# Patient Record
Sex: Female | Born: 1950 | Race: Black or African American | Hispanic: No
Health system: Southern US, Community
[De-identification: ages and names within clinical notes are randomized; demographics above are authoritative.]

## PROBLEM LIST (undated history)

## (undated) DIAGNOSIS — E119 Type 2 diabetes mellitus without complications: Secondary | ICD-10-CM

## (undated) DIAGNOSIS — I10 Essential (primary) hypertension: Secondary | ICD-10-CM

## (undated) DIAGNOSIS — K08109 Complete loss of teeth, unspecified cause, unspecified class: Secondary | ICD-10-CM

## (undated) HISTORY — DX: Essential (primary) hypertension: I10

## (undated) HISTORY — DX: Type 2 diabetes mellitus without complications: E11.9

---

## 2005-08-13 ENCOUNTER — Ambulatory Visit: Payer: Self-pay

## 2006-08-09 ENCOUNTER — Emergency Department: Payer: Self-pay | Admitting: Emergency Medicine

## 2006-08-16 ENCOUNTER — Emergency Department: Payer: Self-pay | Admitting: Emergency Medicine

## 2009-02-28 ENCOUNTER — Inpatient Hospital Stay: Payer: Self-pay | Admitting: *Deleted

## 2010-03-27 IMAGING — CR DG CHEST 2V
1 series · 2 of 2 positions shown · non-contrast
Comparison: none

REASON FOR EXAM: dizziness
COMMENTS:

PROCEDURE:     DXR - DXR CHEST PA (OR AP) AND LATERAL  - February 28, 2009  [DATE]
RESULT:
The lungs are clear.  The cardiac silhouette and visualized bony skeleton
are unremarkable.

[Series 1: view not recorded · 0.17mm/px · 2 of 2 slices shown]
[im 1/2]
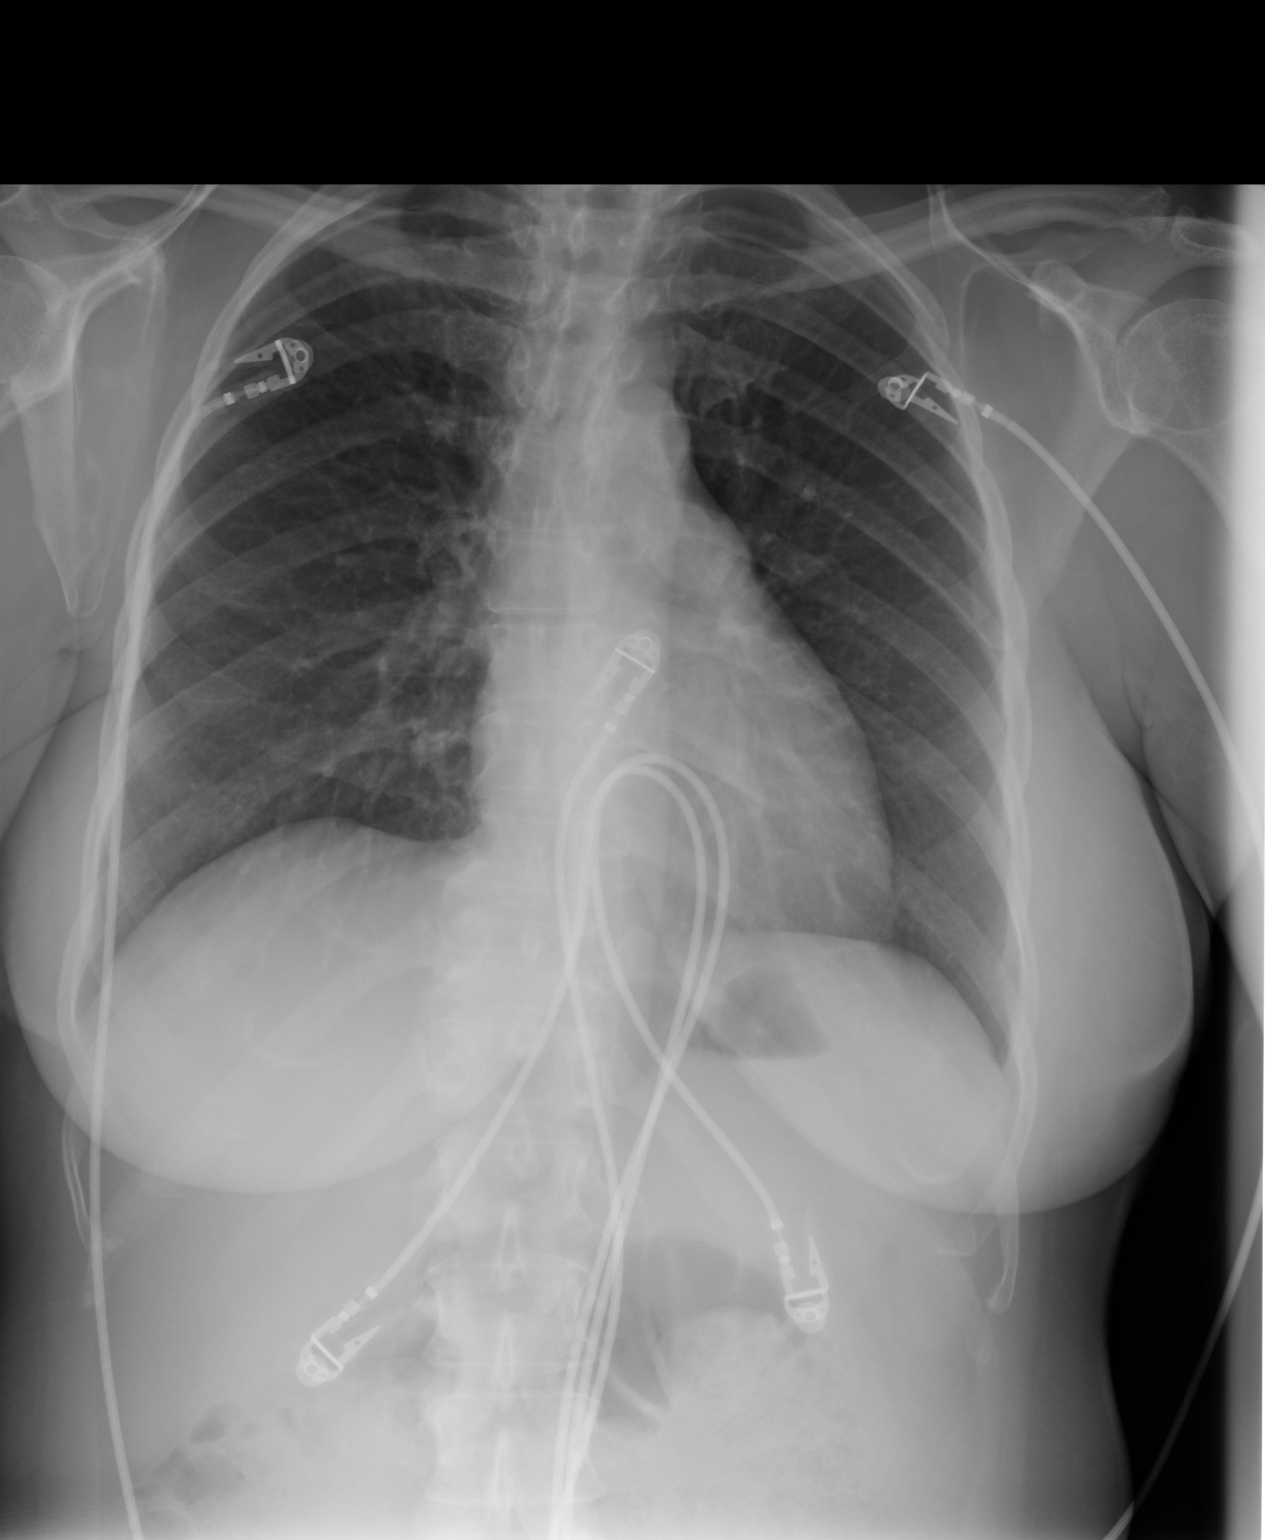
[im 2/2]
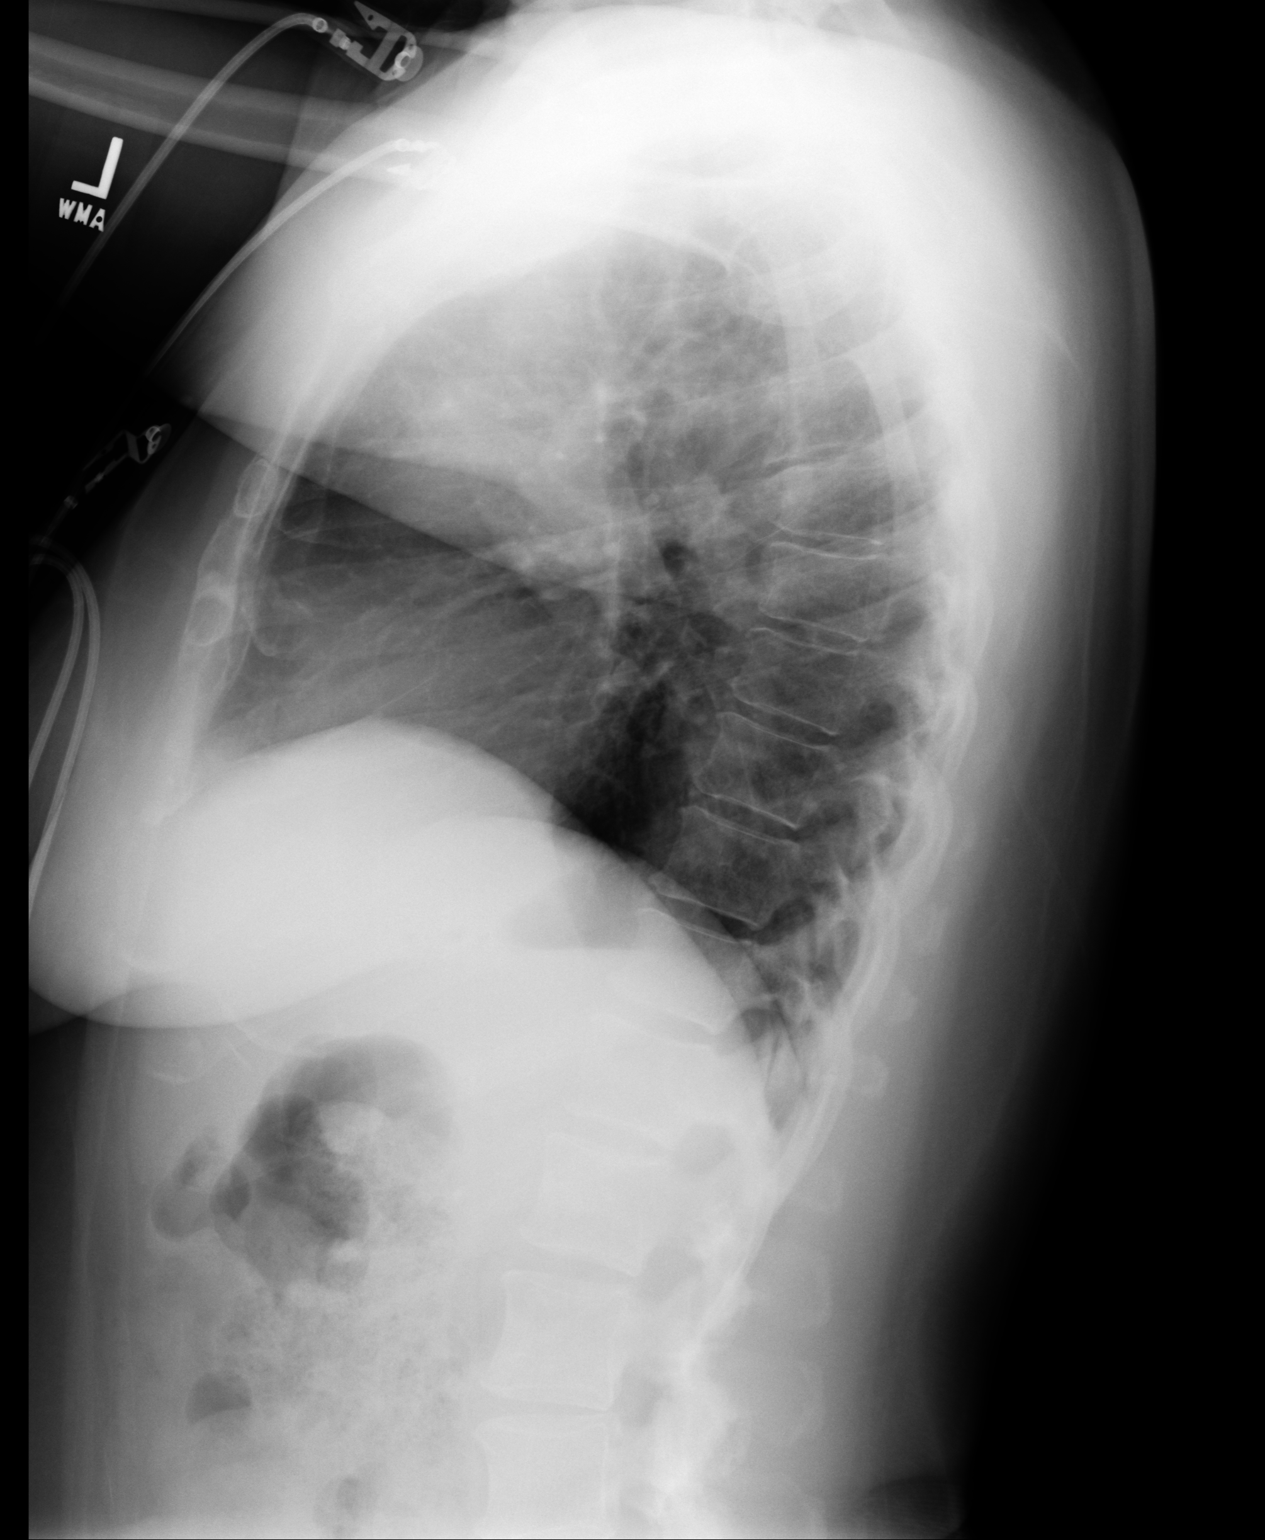

[2 of 2 positions shown; findings below may reference images not displayed]

IMPRESSION: Chest radiograph without evidence of acute cardiopulmonary disease.

## 2014-06-22 ENCOUNTER — Emergency Department: Payer: Self-pay | Admitting: Emergency Medicine

## 2014-06-22 LAB — CBC WITH DIFFERENTIAL/PLATELET
BASOS PCT: 1 %
Basophil #: 0.1 10*3/uL (ref 0.0–0.1)
EOS PCT: 2.4 %
Eosinophil #: 0.1 10*3/uL (ref 0.0–0.7)
HCT: 34.9 % — ABNORMAL LOW (ref 35.0–47.0)
HGB: 11.3 g/dL — AB (ref 12.0–16.0)
LYMPHS PCT: 29.4 %
Lymphocyte #: 1.7 10*3/uL (ref 1.0–3.6)
MCH: 29.5 pg (ref 26.0–34.0)
MCHC: 32.3 g/dL (ref 32.0–36.0)
MCV: 91 fL (ref 80–100)
MONOS PCT: 8.8 %
Monocyte #: 0.5 x10 3/mm (ref 0.2–0.9)
NEUTROS ABS: 3.4 10*3/uL (ref 1.4–6.5)
Neutrophil %: 58.4 %
PLATELETS: 279 10*3/uL (ref 150–440)
RBC: 3.82 10*6/uL (ref 3.80–5.20)
RDW: 14.2 % (ref 11.5–14.5)
WBC: 5.8 10*3/uL (ref 3.6–11.0)

## 2014-06-22 LAB — COMPREHENSIVE METABOLIC PANEL
ALBUMIN: 4.1 g/dL (ref 3.4–5.0)
ALK PHOS: 68 U/L
ALT: 19 U/L
Anion Gap: 9 (ref 7–16)
BILIRUBIN TOTAL: 0.1 mg/dL — AB (ref 0.2–1.0)
BUN: 30 mg/dL — AB (ref 7–18)
Calcium, Total: 8.9 mg/dL (ref 8.5–10.1)
Chloride: 110 mmol/L — ABNORMAL HIGH (ref 98–107)
Co2: 20 mmol/L — ABNORMAL LOW (ref 21–32)
Creatinine: 1.6 mg/dL — ABNORMAL HIGH (ref 0.60–1.30)
EGFR (African American): 39 — ABNORMAL LOW
EGFR (Non-African Amer.): 34 — ABNORMAL LOW
Glucose: 146 mg/dL — ABNORMAL HIGH (ref 65–99)
Osmolality: 286 (ref 275–301)
Potassium: 4.6 mmol/L (ref 3.5–5.1)
SGOT(AST): 13 U/L — ABNORMAL LOW (ref 15–37)
SODIUM: 139 mmol/L (ref 136–145)
Total Protein: 8 g/dL (ref 6.4–8.2)

## 2014-10-03 LAB — TSH: TSH: 1.25 u[IU]/mL (ref 0.41–5.90)

## 2014-10-11 DIAGNOSIS — D649 Anemia, unspecified: Secondary | ICD-10-CM | POA: Insufficient documentation

## 2015-01-02 LAB — LIPID PANEL
CHOLESTEROL: 260 mg/dL — AB (ref 0–200)
HDL: 54 mg/dL (ref 35–70)
LDL CALC: 187 mg/dL
Triglycerides: 94 mg/dL (ref 40–160)

## 2015-01-02 LAB — HEPATIC FUNCTION PANEL
ALT: 12 U/L (ref 7–35)
AST: 10 U/L — AB (ref 13–35)
Alkaline Phosphatase: 60 U/L (ref 25–125)
Bilirubin, Total: 0.2 mg/dL

## 2015-01-02 LAB — BASIC METABOLIC PANEL
BUN: 21 mg/dL (ref 4–21)
Creatinine: 1 mg/dL (ref 0.5–1.1)
GLUCOSE: 121 mg/dL
POTASSIUM: 5.3 mmol/L (ref 3.4–5.3)
Sodium: 142 mmol/L (ref 137–147)

## 2015-01-02 LAB — HEMOGLOBIN A1C: Hemoglobin A1C: 7

## 2015-01-17 DIAGNOSIS — E538 Deficiency of other specified B group vitamins: Secondary | ICD-10-CM | POA: Insufficient documentation

## 2015-02-14 ENCOUNTER — Ambulatory Visit: Payer: Self-pay

## 2015-04-11 ENCOUNTER — Other Ambulatory Visit: Payer: Self-pay

## 2015-04-11 ENCOUNTER — Ambulatory Visit: Payer: Self-pay | Admitting: Internal Medicine

## 2015-04-11 LAB — CBC AND DIFFERENTIAL
HEMATOCRIT: 35 % — AB (ref 36–46)
HEMOGLOBIN: 11.5 g/dL — AB (ref 12.0–16.0)
NEUTROS ABS: 2 /uL
Platelets: 246 10*3/uL (ref 150–399)
WBC: 4.6 10*3/mL

## 2015-04-18 ENCOUNTER — Ambulatory Visit: Payer: Self-pay | Admitting: Internal Medicine

## 2015-04-18 DIAGNOSIS — E785 Hyperlipidemia, unspecified: Secondary | ICD-10-CM | POA: Insufficient documentation

## 2015-04-26 ENCOUNTER — Ambulatory Visit: Payer: Self-pay

## 2015-04-26 DIAGNOSIS — E119 Type 2 diabetes mellitus without complications: Secondary | ICD-10-CM | POA: Insufficient documentation

## 2015-04-26 DIAGNOSIS — I10 Essential (primary) hypertension: Secondary | ICD-10-CM | POA: Insufficient documentation

## 2015-08-01 ENCOUNTER — Other Ambulatory Visit: Payer: Self-pay

## 2015-08-08 ENCOUNTER — Ambulatory Visit: Payer: Self-pay | Admitting: Internal Medicine

## 2016-04-16 DIAGNOSIS — E538 Deficiency of other specified B group vitamins: Secondary | ICD-10-CM

## 2016-04-16 DIAGNOSIS — E119 Type 2 diabetes mellitus without complications: Secondary | ICD-10-CM

## 2016-04-16 DIAGNOSIS — I10 Essential (primary) hypertension: Secondary | ICD-10-CM

## 2016-04-16 DIAGNOSIS — D649 Anemia, unspecified: Secondary | ICD-10-CM

## 2016-04-16 DIAGNOSIS — E785 Hyperlipidemia, unspecified: Secondary | ICD-10-CM

## 2019-12-05 ENCOUNTER — Ambulatory Visit: Payer: Medicare Other | Attending: Internal Medicine

## 2019-12-05 DIAGNOSIS — Z23 Encounter for immunization: Secondary | ICD-10-CM

## 2019-12-05 NOTE — Progress Notes (Signed)
   Covid-19 Vaccination Clinic  Name:  Margaret Rose    MRN: [id number] DOB: [birth year]  12/05/2019  Margaret Rose was observed post Covid-19 immunization for 15 minutes without incidence. She was provided with Vaccine Information Sheet and instruction to access the V-Safe system.   Margaret Rose was instructed to call 911 with any severe reactions post vaccine: Marland Kitchen Difficulty breathing  . Swelling of your face and throat  . A fast heartbeat  . A bad rash all over your body  . Dizziness and weakness    Immunizations Administered    Name Date Dose VIS Date Route   Moderna COVID-19 Vaccine 12/05/2019  3:51 PM 0.5 mL 09/13/2019 Intramuscular   Manufacturer: Moderna   Lot: [id number]   NDC: 86761-950-93

## 2020-01-03 ENCOUNTER — Ambulatory Visit: Payer: Medicare Other | Attending: Internal Medicine

## 2020-01-03 DIAGNOSIS — Z23 Encounter for immunization: Secondary | ICD-10-CM

## 2020-01-03 NOTE — Progress Notes (Signed)
   Covid-19 Vaccination Clinic  Name:  Margaret Rose    MRN: [id number] DOB: [birth year]  01/03/2020  [name] was observed post Covid-19 immunization for 15 minutes without incident. She was provided with Vaccine Information Sheet and instruction to access the V-Safe system.   [name] was instructed to call 911 with any severe reactions post vaccine: Marland Kitchen Difficulty breathing  . Swelling of face and throat  . A fast heartbeat  . A bad rash all over body  . Dizziness and weakness   Immunizations Administered    Name Date Dose VIS Date Route   Moderna COVID-19 Vaccine 01/03/2020 10:39 AM 0.5 mL 09/13/2019 Intramuscular   Manufacturer: Gala Murdoch   Lot: [id number]   NDC: 34287-681-15

## 2023-11-09 ENCOUNTER — Ambulatory Visit: Admit: 2023-11-09 | Payer: 59 | Admitting: Ophthalmology

## 2023-11-09 SURGERY — CATARACT EXTRACTION PHACO AND INTRAOCULAR LENS PLACEMENT (IOC)
Anesthesia: Topical | Laterality: Right

## 2023-11-30 ENCOUNTER — Ambulatory Visit: Admit: 2023-11-30 | Payer: 59 | Admitting: Ophthalmology

## 2023-11-30 SURGERY — PHACOEMULSIFICATION, CATARACT, WITH IOL INSERTION
Anesthesia: Topical | Laterality: Right

## 2024-03-03 ENCOUNTER — Encounter: Payer: Self-pay | Admitting: Ophthalmology

## 2024-03-04 NOTE — Anesthesia Preprocedure Evaluation (Addendum)
 Anesthesia Evaluation  Patient identified by MRN, date of birth, ID band Patient awake    Reviewed: Allergy & Precautions, H&P , NPO status , Patient's Chart, lab work & pertinent test results  Airway Mallampati: III  TM Distance: <3 FB Neck ROM: Full    Dental no notable dental hx. (+) Edentulous Upper, Edentulous Lower   Pulmonary neg pulmonary ROS   Pulmonary exam normal breath sounds clear to auscultation       Cardiovascular hypertension, Normal cardiovascular exam Rhythm:Regular Rate:Normal     Neuro/Psych negative neurological ROS  negative psych ROS   GI/Hepatic negative GI ROS, Neg liver ROS,,,  Endo/Other  diabetes    Renal/GU negative Renal ROS  negative genitourinary   Musculoskeletal negative musculoskeletal ROS (+)    Abdominal   Peds negative pediatric ROS (+)  Hematology negative hematology ROS (+) Blood dyscrasia, anemia   Anesthesia Other Findings Diabetes melliltus HTN   Reproductive/Obstetrics negative OB ROS                             Anesthesia Physical Anesthesia Plan  ASA: 2  Anesthesia Plan: MAC   Post-op Pain Management:    Induction: Intravenous  PONV Risk Score and Plan:   Airway Management Planned: Natural Airway and Nasal Cannula  Additional Equipment:   Intra-op Plan:   Post-operative Plan:   Informed Consent: I have reviewed the patients History and Physical, chart, labs and discussed the procedure including the risks, benefits and alternatives for the proposed anesthesia with the patient or authorized representative who has indicated his/her understanding and acceptance.     Dental Advisory Given  Plan Discussed with: Anesthesiologist, CRNA and Surgeon  Anesthesia Plan Comments: (Patient consented for risks of anesthesia including but not limited to:  - adverse reactions to medications - damage to eyes, teeth, lips or other oral  mucosa - nerve damage due to positioning  - sore throat or hoarseness - Damage to heart, brain, nerves, lungs, other parts of body or loss of life  Patient voiced understanding and assent.)       Anesthesia Quick Evaluation

## 2024-03-11 NOTE — Discharge Instructions (Signed)

## 2024-03-14 ENCOUNTER — Other Ambulatory Visit: Payer: Self-pay

## 2024-03-14 ENCOUNTER — Ambulatory Visit: Payer: Self-pay | Admitting: Anesthesiology

## 2024-03-14 ENCOUNTER — Ambulatory Visit
Admission: RE | Admit: 2024-03-14 | Discharge: 2024-03-14 | Disposition: A | Discharge status: Home / Self Care | Attending: Ophthalmology | Admitting: Ophthalmology

## 2024-03-14 ENCOUNTER — Encounter
Admission: RE | Disposition: A | Discharge status: Home / Self Care | Payer: Self-pay | Source: Home / Self Care | Attending: Ophthalmology

## 2024-03-14 ENCOUNTER — Encounter: Payer: Self-pay | Admitting: Ophthalmology

## 2024-03-14 DIAGNOSIS — E1136 Type 2 diabetes mellitus with diabetic cataract: Secondary | ICD-10-CM | POA: Insufficient documentation

## 2024-03-14 DIAGNOSIS — H2511 Age-related nuclear cataract, right eye: Secondary | ICD-10-CM | POA: Diagnosis not present

## 2024-03-14 DIAGNOSIS — Z7984 Long term (current) use of oral hypoglycemic drugs: Secondary | ICD-10-CM | POA: Diagnosis not present

## 2024-03-14 DIAGNOSIS — I10 Essential (primary) hypertension: Secondary | ICD-10-CM | POA: Diagnosis not present

## 2024-03-14 DIAGNOSIS — H2181 Floppy iris syndrome: Secondary | ICD-10-CM | POA: Diagnosis not present

## 2024-03-14 DIAGNOSIS — H2589 Other age-related cataract: Secondary | ICD-10-CM | POA: Diagnosis present

## 2024-03-14 HISTORY — PX: CATARACT EXTRACTION W/PHACO: SHX586

## 2024-03-14 HISTORY — DX: Complete loss of teeth, unspecified cause, unspecified class: K08.109

## 2024-03-14 LAB — GLUCOSE, CAPILLARY
Glucose-Capillary: 79 mg/dL (ref 70–99)
Glucose-Capillary: 87 mg/dL (ref 70–99)

## 2024-03-14 SURGERY — PHACOEMULSIFICATION, CATARACT, WITH IOL INSERTION
Anesthesia: Monitor Anesthesia Care | Site: Eye | Laterality: Right

## 2024-03-14 MED ORDER — FENTANYL CITRATE (PF) 100 MCG/2ML IJ SOLN
INTRAMUSCULAR | Status: AC
  Filled 2024-03-14: qty 2

## 2024-03-14 MED ORDER — SIGHTPATH DOSE#1 NA HYALUR & NA CHOND-NA HYALUR IO KIT
PACK | INTRAOCULAR | Status: DC | PRN
  Administered 2024-03-14: 1 via OPHTHALMIC

## 2024-03-14 MED ORDER — SIGHTPATH DOSE#1 BSS IO SOLN
INTRAOCULAR | Status: DC | PRN
  Administered 2024-03-14: 15 mL via INTRAOCULAR

## 2024-03-14 MED ORDER — ARMC OPHTHALMIC DILATING DROPS
1.0000 | OPHTHALMIC | Status: DC | PRN
  Administered 2024-03-14 (×3): 1 via OPHTHALMIC

## 2024-03-14 MED ORDER — FENTANYL CITRATE (PF) 100 MCG/2ML IJ SOLN
INTRAMUSCULAR | Status: DC | PRN
  Administered 2024-03-14: 50 ug via INTRAVENOUS

## 2024-03-14 MED ORDER — MOXIFLOXACIN HCL 0.5 % OP SOLN
OPHTHALMIC | Status: DC | PRN
  Administered 2024-03-14: .2 mL via OPHTHALMIC

## 2024-03-14 MED ORDER — MIDAZOLAM HCL 2 MG/2ML IJ SOLN
INTRAMUSCULAR | Status: DC | PRN
  Administered 2024-03-14: 2 mg via INTRAVENOUS

## 2024-03-14 MED ORDER — ARMC OPHTHALMIC DILATING DROPS
OPHTHALMIC | Status: AC
  Filled 2024-03-14: qty 0.5

## 2024-03-14 MED ORDER — MIDAZOLAM HCL 2 MG/2ML IJ SOLN
INTRAMUSCULAR | Status: AC
  Filled 2024-03-14: qty 2

## 2024-03-14 MED ORDER — SIGHTPATH DOSE#1 BSS IO SOLN
INTRAOCULAR | Status: DC | PRN
  Administered 2024-03-14: 137 mL via OPHTHALMIC

## 2024-03-14 MED ORDER — TRYPAN BLUE 0.06 % IO SOSY
PREFILLED_SYRINGE | INTRAOCULAR | Status: DC | PRN
  Administered 2024-03-14: .5 mL via INTRAOCULAR

## 2024-03-14 MED ORDER — DEXTROSE 50 % IV SOLN
INTRAVENOUS | Status: AC
Start: 2024-03-14 — End: ?
  Filled 2024-03-14: qty 50

## 2024-03-14 MED ORDER — LACTATED RINGERS IV SOLN: INTRAVENOUS | Status: DC

## 2024-03-14 MED ORDER — TETRACAINE HCL 0.5 % OP SOLN
1.0000 [drp] | OPHTHALMIC | Status: DC | PRN
  Administered 2024-03-14 (×3): 1 [drp] via OPHTHALMIC

## 2024-03-14 MED ORDER — LIDOCAINE HCL (PF) 2 % IJ SOLN
INTRAOCULAR | Status: DC | PRN
  Administered 2024-03-14: 4 mL via INTRAOCULAR

## 2024-03-14 MED ORDER — DEXTROSE 50 % IV SOLN
12.0000 mL | Freq: Once | INTRAVENOUS | Status: AC
  Administered 2024-03-14: 12 mL via INTRAVENOUS

## 2024-03-14 MED ORDER — TETRACAINE HCL 0.5 % OP SOLN
OPHTHALMIC | Status: AC
  Filled 2024-03-14: qty 4

## 2024-03-14 SURGICAL SUPPLY — 15 items
CANNULA ANT/CHMB 27G (MISCELLANEOUS) IMPLANT
CANNULA ANT/CHMB 27GA (MISCELLANEOUS) ×1 IMPLANT
CATARACT SUITE SIGHTPATH (MISCELLANEOUS) ×1 IMPLANT
DISSECTOR HYDRO NUCLEUS 50X22 (MISCELLANEOUS) ×1 IMPLANT
FEE CATARACT SUITE SIGHTPATH (MISCELLANEOUS) ×1 IMPLANT
GLOVE PI ULTRA LF STRL 7.5 (GLOVE) ×1 IMPLANT
GLOVE SURG POLYISOPRENE 8.5 (GLOVE) ×1 IMPLANT
GLOVE SURG PROTEXIS BL SZ6.5 (GLOVE) ×1 IMPLANT
GLOVE SURG SYN 6.5 PF PI BL (GLOVE) ×1 IMPLANT
GLOVE SURG SYN 8.5 PF PI BL (GLOVE) ×1 IMPLANT
LENS IOL TECNIS EYHANCE 21.5 (Intraocular Lens) IMPLANT
NDL FILTER BLUNT 18X1 1/2 (NEEDLE) ×1 IMPLANT
NEEDLE FILTER BLUNT 18X1 1/2 (NEEDLE) ×1 IMPLANT
RING MALYGIN (MISCELLANEOUS) IMPLANT
SYR 3ML LL SCALE MARK (SYRINGE) ×1 IMPLANT

## 2024-03-14 NOTE — Anesthesia Postprocedure Evaluation (Signed)
 Anesthesia Post Note  Patient: Margaret Rose  Procedure(s) Performed: PHACOEMULSIFICATION, CATARACT, WITH IOL INSERTION 30.32 02:18.7 (Right: Eye)  Patient location during evaluation: PACU Anesthesia Type: MAC Level of consciousness: awake and alert Pain management: pain level controlled Vital Signs Assessment: post-procedure vital signs reviewed and stable Respiratory status: spontaneous breathing, nonlabored ventilation, respiratory function stable and patient connected to nasal cannula oxygen Cardiovascular status: stable and blood pressure returned to baseline Postop Assessment: no apparent nausea or vomiting Anesthetic complications: no   No notable events documented.   Last Vitals:  Vitals:   03/14/24 0756 03/14/24 0801  BP: 130/77 135/74  Pulse: 74 73  Resp: 15 17  Temp: (!) 36.2 C (!) 36.2 C  SpO2: 98% 98%    Last Pain:  Vitals:   03/14/24 0801  TempSrc:   PainSc: 0-No pain                 Mireya Meditz C Jhonatan Lomeli

## 2024-03-14 NOTE — Transfer of Care (Signed)
 Immediate Anesthesia Transfer of Care Note  Patient: Margaret Rose  Procedure(s) Performed: PHACOEMULSIFICATION, CATARACT, WITH IOL INSERTION 30.32 02:18.7 (Right: Eye)  Patient Location: PACU  Anesthesia Type: MAC  Level of Consciousness: awake, alert  and patient cooperative  Airway and Oxygen Therapy: Patient Spontanous Breathing and Patient connected to supplemental oxygen  Post-op Assessment: Post-op Vital signs reviewed, Patient's Cardiovascular Status Stable, Respiratory Function Stable, Patent Airway and No signs of Nausea or vomiting  Post-op Vital Signs: Reviewed and stable  Complications: No notable events documented.

## 2024-03-14 NOTE — H&P (Signed)
 [facility]   Primary Care Physician:  Laurence Pons, NP Ophthalmologist: Dr. Dusty Gin  Pre-Procedure History & Physical: HPI:  Margaret Rose is a 73 y.o. female here for cataract surgery.   Past Medical History:  Diagnosis Date   Diabetes mellitus without complication (HCC)    Hypertension     Past Surgical History:  Procedure Laterality Date   CESAREAN SECTION     x 2    Prior to Admission medications   Medication Sig Start Date End Date Taking? Authorizing Provider  amLODipine (NORVASC) 10 MG tablet Take 10 mg by mouth daily.   Yes [provider]  aspirin 81 MG tablet Take 81 mg by mouth daily.   Yes [provider]  ezetimibe (ZETIA) 10 MG tablet Take 10 mg by mouth daily.   Yes [provider]  glipiZIDE (GLUCOTROL) 5 MG tablet Take by mouth 2 (two) times daily before a meal.   Yes [provider]  lisinopril (PRINIVIL,ZESTRIL) 20 MG tablet Take 20 mg by mouth 2 (two) times daily.   Yes [provider]  metFORMIN (GLUCOPHAGE) 1000 MG tablet Take 1,000 mg by mouth 2 (two) times daily with a meal.   Yes [provider]    Allergies as of 01/21/2024 - never reviewed  Allergen Reaction Noted   Statins  04/16/2016    Family History  Problem Relation Age of Onset   Heart disease Mother    Renal Disease Mother    Heart disease Father     Social History   Socioeconomic History   Marital status: Widowed    Spouse name: Not on file   Number of children: Not on file   Years of education: Not on file   Highest education level: Not on file  Occupational History   Not on file  Tobacco Use   Smoking status: Never   Smokeless tobacco: Never  Vaping Use   Vaping status: Never Used  Substance and Sexual Activity   Alcohol use: No   Drug use: No   Sexual activity: Not on file  Other Topics Concern   Not on file  Social History Narrative   Not on file   Social Drivers of Health   Financial  Resource Strain: Low Risk  (10/22/2023)   Received from [facility]   Overall Financial Resource Strain (CARDIA)    Difficulty of Paying Living Expenses: Not hard at all  Food Insecurity: No Food Insecurity (10/22/2023)   Received from [facility]   Hunger Vital Sign    Worried About Running Out of Food in the Last Year: Never true    Ran Out of Food in the Last Year: Never true  Transportation Needs: No Transportation Needs (10/22/2023)   Received from [facility]   PRAPARE - Transportation    Lack of Transportation (Medical): No    Lack of Transportation (Non-Medical): No  Physical Activity: Insufficiently Active (10/22/2023)   Received from [facility]   Exercise Vital Sign    Days of Exercise per Week: 2 days    Minutes of Exercise per Session: 30 min  Stress: No Stress Concern Present (10/22/2023)   Received from [facility] - Occupational Stress Questionnaire    Feeling of Stress : Not at all  Social Connections: Moderately Integrated (10/22/2023)   Received from [facility]   Social Connection and Isolation Panel [NHANES]    Frequency  of Communication with Friends and Family: More than three times a week    Frequency of Social Gatherings with Friends and Family: More than three times a week    Attends Religious Services: More than 4 times per year    Active Member of [employer] or Organizations: Yes    Attends Banker Meetings: More than 4 times per year    Marital Status: Never married  Intimate Partner Violence: Not At Risk (10/22/2023)   Received from [facility]   Humiliation, Afraid, Rape, and Kick questionnaire    Fear of Current or Ex-Partner: No    Emotionally Abused: No    Physically Abused: No    Sexually Abused: No    Review of Systems: See HPI, otherwise negative ROS  Physical Exam: BP (!) 156/96   Pulse 78   Temp (!) 97.3 F (36.3 C) (Temporal)   Resp 17   Ht 5\' 4"  (1.626 m)   Wt  64.9 kg   SpO2 99%   BMI 24.55 kg/m  General:   Alert, cooperative. Head:  Normocephalic and atraumatic. Respiratory:  Normal work of breathing. Cardiovascular:  NAD  Impression/Plan: Margaret Rose is here for cataract surgery.  Risks, benefits, limitations, and alternatives regarding cataract surgery have been reviewed with the patient.  Questions have been answered.  All parties agreeable.   Dusty Gin, MD  03/14/2024, 7:16 AM

## 2024-03-14 NOTE — Op Note (Signed)
 OPERATIVE NOTE  Margaret Rose [id number] 03/14/2024   PREOPERATIVE DIAGNOSIS:  Nuclear sclerotic cataract right eye.  H25.11   POSTOPERATIVE DIAGNOSIS:     Mature cataract right eye.   Intraoperative floppy iris syndrome   PROCEDURE:  CPT [phone] Complex Phacoemusification with posterior chamber intraocular lens placement of the right eye, requiring malygin ring for dilation and trypan blue for visualization  LENS:   Implant Name Type Inv. Item Serial No. Manufacturer Lot No. LRB No. Used Action  LENS IOL TECNIS EYHANCE 21.5 - [id number] Intraocular Lens LENS IOL TECNIS EYHANCE 21.5 [id number] [facility]  Right 1 Implanted  LOG [id number] - WLO BIOMET VISION - 1               Procedure(s): PHACOEMULSIFICATION, CATARACT, WITH IOL INSERTION 30.32 02:18.7 (Right)  SURGEON:  Dusty Gin, MD, MPH  ANESTHESIOLOGIST: Anesthesiologist: Emilie Harden, MD CRNA: Sherrlyn Dolores, CRNA   ANESTHESIA:  Topical with tetracaine drops augmented with 1% preservative-free intracameral lidocaine.  ESTIMATED BLOOD LOSS: less than 1 mL.   COMPLICATIONS:  None.   DESCRIPTION OF PROCEDURE:  The patient was identified in the holding room and transported to the operating room and placed in the supine position under the operating microscope.  The right eye was identified as the operative eye and it was prepped and draped in the usual sterile ophthalmic fashion.   A 1.0 millimeter clear-corneal paracentesis was made at the 10:30 position. 0.5 ml of preservative-free 1% lidocaine with epinephrine was injected into the anterior chamber.  There was a poor red reflex, so Trypan blue was instilled under an air bubble and rinsed out with BSS to stain the capsule for improved visualization.   The anterior chamber was filled with viscoelastic.  A 2.4 millimeter keratome was used to make a near-clear corneal incision at the 8:00 position.    The pupil was small so a malyugin ring was placed.  A curvilinear  capsulorrhexis was made with a cystotome and capsulorrhexis forceps.  Balanced salt solution was used to hydrodissect and hydrodelineate the nucleus.   Phacoemulsification was then used in stop and chop fashion to remove the lens nucleus and epinucleus.  The remaining cortex was then removed using the irrigation and aspiration handpiece. Viscoelastic was then placed into the capsular bag to distend it for lens placement.  A lens was then injected into the capsular bag.  The malyugin ring was removed.    The remaining viscoelastic was aspirated.   Wounds were hydrated with balanced salt solution.  The anterior chamber was inflated to a physiologic pressure with balanced salt solution.   Intracameral vigamox 0.1 mL undiluted was injected into the eye and a drop placed onto the ocular surface.  No wound leaks were noted.  The patient was taken to the recovery room in stable condition without complications of anesthesia or surgery  Dusty Gin 03/14/2024, 7:54 AM

## 2024-03-15 ENCOUNTER — Encounter: Payer: Self-pay | Admitting: Ophthalmology

## 2024-04-01 NOTE — Discharge Instructions (Signed)

## 2024-04-04 ENCOUNTER — Ambulatory Visit
Admission: RE | Admit: 2024-04-04 | Discharge: 2024-04-04 | Disposition: A | Discharge status: Home / Self Care | Attending: Ophthalmology | Admitting: Ophthalmology

## 2024-04-04 ENCOUNTER — Ambulatory Visit: Payer: Self-pay | Admitting: Anesthesiology

## 2024-04-04 ENCOUNTER — Other Ambulatory Visit: Payer: Self-pay

## 2024-04-04 ENCOUNTER — Encounter
Admission: RE | Disposition: A | Discharge status: Home / Self Care | Payer: Self-pay | Source: Home / Self Care | Attending: Ophthalmology

## 2024-04-04 ENCOUNTER — Encounter: Payer: Self-pay | Admitting: Ophthalmology

## 2024-04-04 DIAGNOSIS — Z7984 Long term (current) use of oral hypoglycemic drugs: Secondary | ICD-10-CM | POA: Insufficient documentation

## 2024-04-04 DIAGNOSIS — I10 Essential (primary) hypertension: Secondary | ICD-10-CM | POA: Insufficient documentation

## 2024-04-04 DIAGNOSIS — E1136 Type 2 diabetes mellitus with diabetic cataract: Secondary | ICD-10-CM | POA: Insufficient documentation

## 2024-04-04 DIAGNOSIS — Z8249 Family history of ischemic heart disease and other diseases of the circulatory system: Secondary | ICD-10-CM | POA: Diagnosis not present

## 2024-04-04 DIAGNOSIS — H2181 Floppy iris syndrome: Secondary | ICD-10-CM | POA: Diagnosis not present

## 2024-04-04 DIAGNOSIS — H2512 Age-related nuclear cataract, left eye: Secondary | ICD-10-CM | POA: Insufficient documentation

## 2024-04-04 DIAGNOSIS — Z79899 Other long term (current) drug therapy: Secondary | ICD-10-CM | POA: Insufficient documentation

## 2024-04-04 HISTORY — PX: CATARACT EXTRACTION W/PHACO: SHX586

## 2024-04-04 LAB — GLUCOSE, CAPILLARY: Glucose-Capillary: 91 mg/dL (ref 70–99)

## 2024-04-04 SURGERY — PHACOEMULSIFICATION, CATARACT, WITH IOL INSERTION
Anesthesia: Monitor Anesthesia Care | Site: Eye | Laterality: Left

## 2024-04-04 MED ORDER — FENTANYL CITRATE PF 50 MCG/ML IJ SOSY: 25.0000 ug | PREFILLED_SYRINGE | INTRAMUSCULAR | Status: DC | PRN

## 2024-04-04 MED ORDER — OXYCODONE HCL 5 MG PO TABS: 5.0000 mg | ORAL_TABLET | Freq: Once | ORAL | Status: DC | PRN

## 2024-04-04 MED ORDER — SIGHTPATH DOSE#1 BSS IO SOLN
INTRAOCULAR | Status: DC | PRN
  Administered 2024-04-04: 15 mL via INTRAOCULAR

## 2024-04-04 MED ORDER — DROPERIDOL 2.5 MG/ML IJ SOLN: 0.6250 mg | Freq: Once | INTRAMUSCULAR | Status: DC | PRN

## 2024-04-04 MED ORDER — SODIUM CHLORIDE 0.9% FLUSH
INTRAVENOUS | Status: DC | PRN
  Administered 2024-04-04: 10 mL via INTRAVENOUS

## 2024-04-04 MED ORDER — LACTATED RINGERS IV SOLN: INTRAVENOUS | Status: DC

## 2024-04-04 MED ORDER — ARMC OPHTHALMIC DILATING DROPS
1.0000 | OPHTHALMIC | Status: DC | PRN
  Administered 2024-04-04 (×3): 1 via OPHTHALMIC

## 2024-04-04 MED ORDER — FENTANYL CITRATE (PF) 100 MCG/2ML IJ SOLN
INTRAMUSCULAR | Status: AC
  Filled 2024-04-04: qty 2

## 2024-04-04 MED ORDER — ACETAMINOPHEN 10 MG/ML IV SOLN: 1000.0000 mg | Freq: Once | INTRAVENOUS | Status: DC | PRN

## 2024-04-04 MED ORDER — MIDAZOLAM HCL 2 MG/2ML IJ SOLN
INTRAMUSCULAR | Status: DC | PRN
  Administered 2024-04-04: 1 mg via INTRAVENOUS

## 2024-04-04 MED ORDER — TETRACAINE HCL 0.5 % OP SOLN
1.0000 [drp] | OPHTHALMIC | Status: DC | PRN
  Administered 2024-04-04 (×3): 1 [drp] via OPHTHALMIC

## 2024-04-04 MED ORDER — FENTANYL CITRATE (PF) 100 MCG/2ML IJ SOLN
INTRAMUSCULAR | Status: DC | PRN
  Administered 2024-04-04: 50 ug via INTRAVENOUS

## 2024-04-04 MED ORDER — LIDOCAINE HCL (PF) 2 % IJ SOLN
INTRAOCULAR | Status: DC | PRN
  Administered 2024-04-04: 4 mL via INTRAOCULAR

## 2024-04-04 MED ORDER — ARMC OPHTHALMIC DILATING DROPS
OPHTHALMIC | Status: AC
  Filled 2024-04-04: qty 0.5

## 2024-04-04 MED ORDER — MOXIFLOXACIN HCL 0.5 % OP SOLN
OPHTHALMIC | Status: DC | PRN
  Administered 2024-04-04: .2 mL via OPHTHALMIC

## 2024-04-04 MED ORDER — SIGHTPATH DOSE#1 NA HYALUR & NA CHOND-NA HYALUR IO KIT
PACK | INTRAOCULAR | Status: DC | PRN
  Administered 2024-04-04: 1 via OPHTHALMIC

## 2024-04-04 MED ORDER — TETRACAINE HCL 0.5 % OP SOLN
OPHTHALMIC | Status: AC
  Filled 2024-04-04: qty 4

## 2024-04-04 MED ORDER — BRIMONIDINE TARTRATE-TIMOLOL 0.2-0.5 % OP SOLN
OPHTHALMIC | Status: DC | PRN
  Administered 2024-04-04: 1 [drp] via OPHTHALMIC

## 2024-04-04 MED ORDER — SIGHTPATH DOSE#1 BSS IO SOLN
INTRAOCULAR | Status: DC | PRN
  Administered 2024-04-04: 90 mL via OPHTHALMIC

## 2024-04-04 MED ORDER — MIDAZOLAM HCL 2 MG/2ML IJ SOLN
INTRAMUSCULAR | Status: AC
  Filled 2024-04-04: qty 2

## 2024-04-04 MED ORDER — OXYCODONE HCL 5 MG/5ML PO SOLN: 5.0000 mg | Freq: Once | ORAL | Status: DC | PRN

## 2024-04-04 SURGICAL SUPPLY — 13 items
CATARACT SUITE SIGHTPATH (MISCELLANEOUS) ×1 IMPLANT
DISSECTOR HYDRO NUCLEUS 50X22 (MISCELLANEOUS) ×1 IMPLANT
FEE CATARACT SUITE SIGHTPATH (MISCELLANEOUS) ×1 IMPLANT
GLOVE PI ULTRA LF STRL 7.5 (GLOVE) ×1 IMPLANT
GLOVE SURG POLYISOPRENE 8.5 (GLOVE) ×1 IMPLANT
GLOVE SURG PROTEXIS BL SZ6.5 (GLOVE) ×1 IMPLANT
GLOVE SURG SYN 6.5 PF PI BL (GLOVE) ×1 IMPLANT
GLOVE SURG SYN 8.5 PF PI BL (GLOVE) ×1 IMPLANT
LENS IOL TECNIS EYHANCE 22.0 (Intraocular Lens) IMPLANT
NDL FILTER BLUNT 18X1 1/2 (NEEDLE) ×1 IMPLANT
NEEDLE FILTER BLUNT 18X1 1/2 (NEEDLE) ×1 IMPLANT
RING MALYGIN (MISCELLANEOUS) IMPLANT
SYR 3ML LL SCALE MARK (SYRINGE) ×1 IMPLANT

## 2024-04-04 NOTE — H&P (Signed)
 [facility]   Primary Care Physician:  Geralene Levorn ORN, NP Ophthalmologist: Dr. Adine Novak  Pre-Procedure History & Physical: HPI:  Margaret Rose is a 73 y.o. female here for cataract surgery.   Past Medical History:  Diagnosis Date   Diabetes mellitus without complication (HCC)    Edentulous    Hypertension     Past Surgical History:  Procedure Laterality Date   CATARACT EXTRACTION W/PHACO Right 03/14/2024   Procedure: PHACOEMULSIFICATION, CATARACT, WITH IOL INSERTION 30.32 02:18.7;  Surgeon: Novak Adine Anes, MD;  Location: [facility];  Service: Ophthalmology;  Laterality: Right;   CESAREAN SECTION     x 2    Prior to Admission medications   Medication Sig Start Date End Date Taking? Authorizing Provider  amLODipine (NORVASC) 10 MG tablet Take 10 mg by mouth daily.   Yes [provider]  aspirin 81 MG tablet Take 81 mg by mouth daily.   Yes [provider]  ezetimibe (ZETIA) 10 MG tablet Take 10 mg by mouth daily.   Yes [provider]  glipiZIDE (GLUCOTROL) 5 MG tablet Take by mouth 2 (two) times daily before a meal.   Yes [provider]  lisinopril (PRINIVIL,ZESTRIL) 20 MG tablet Take 20 mg by mouth 2 (two) times daily.   Yes [provider]  metFORMIN (GLUCOPHAGE) 1000 MG tablet Take 1,000 mg by mouth 2 (two) times daily with a meal.   Yes [provider]    Allergies as of 01/21/2024 - never reviewed  Allergen Reaction Noted   Statins  04/16/2016    Family History  Problem Relation Age of Onset   Heart disease Mother    Renal Disease Mother    Heart disease Father     Social History   Socioeconomic History   Marital status: Widowed    Spouse name: Not on file   Number of children: Not on file   Years of education: Not on file   Highest education level: Not on file  Occupational History   Not on file  Tobacco Use   Smoking status: Never   Smokeless tobacco: Never  Vaping Use    Vaping status: Never Used  Substance and Sexual Activity   Alcohol use: No   Drug use: No   Sexual activity: Not on file  Other Topics Concern   Not on file  Social History Narrative   Not on file   Social Drivers of Health   Financial Resource Strain: Low Risk  (10/22/2023)   Received from [facility]   Overall Financial Resource Strain (CARDIA)    Difficulty of Paying Living Expenses: Not hard at all  Food Insecurity: No Food Insecurity (10/22/2023)   Received from [facility]   Hunger Vital Sign    Within the past 12 months, you worried that your food would run out before you got the money to buy more.: Never true    Within the past 12 months, the food you bought just didn't last and you didn't have money to get more.: Never true  Transportation Needs: No Transportation Needs (10/22/2023)   Received from [facility]   PRAPARE - Transportation    Lack of Transportation (Medical): No    Lack of Transportation (Non-Medical): No  Physical Activity: Insufficiently Active (10/22/2023)   Received from [facility]   Exercise Vital Sign    On average, how many days per week do you engage in moderate to strenuous exercise (like  a brisk walk)?: 2 days    On average, how many minutes do you engage in exercise at this level?: 30 min  Stress: No Stress Concern Present (10/22/2023)   Received from [facility] - Occupational Stress Questionnaire    Feeling of Stress : Not at all  Social Connections: Moderately Integrated (10/22/2023)   Received from [facility]   Social Connection and Isolation Panel    In a typical week, how many times do you talk on the phone with family, friends, or neighbors?: More than three times a week    How often do you get together with friends or relatives?: More than three times a week    How often do you attend church or religious services?: More than 4 times per year    Do you belong to any clubs or  organizations such as church groups, unions, fraternal or athletic groups, or school groups?: Yes    How often do you attend meetings of the clubs or organizations you belong to?: More than 4 times per year    Are you married, widowed, divorced, separated, never married, or living with a partner?: Never married  Intimate Partner Violence: Not At Risk (10/22/2023)   Received from [facility]   Humiliation, Afraid, Rape, and Kick questionnaire    Within the last year, have you been afraid of your partner or ex-partner?: No    Within the last year, have you been humiliated or emotionally abused in other ways by your partner or ex-partner?: No    Within the last year, have you been kicked, hit, slapped, or otherwise physically hurt by your partner or ex-partner?: No    Within the last year, have you been raped or forced to have any kind of sexual activity by your partner or ex-partner?: No    Review of Systems: See HPI, otherwise negative ROS  Physical Exam: BP 114/73   Pulse 75   Temp 97.9 F (36.6 C)   Resp 15   Ht 5' 4 (1.626 m)   Wt 64.9 kg   SpO2 99%   BMI 24.55 kg/m  General:   Alert, cooperative. Head:  Normocephalic and atraumatic. Respiratory:  Normal work of breathing. Cardiovascular:  NAD  Impression/Plan: Margaret Rose is here for cataract surgery.  Risks, benefits, limitations, and alternatives regarding cataract surgery have been reviewed with the patient.  Questions have been answered.  All parties agreeable.   Adine Novak, MD  04/04/2024, 7:18 AM

## 2024-04-04 NOTE — Anesthesia Postprocedure Evaluation (Signed)
 Anesthesia Post Note  Patient: Margaret Rose  Procedure(s) Performed: PHACOEMULSIFICATION, CATARACT, WITH IOL INSERTION 7.06 00:50.3 (Left: Eye)  Patient location during evaluation: PACU Anesthesia Type: MAC Level of consciousness: awake and alert Pain management: pain level controlled Vital Signs Assessment: post-procedure vital signs reviewed and stable Respiratory status: spontaneous breathing, nonlabored ventilation, respiratory function stable and patient connected to nasal cannula oxygen Cardiovascular status: blood pressure returned to baseline and stable Postop Assessment: no apparent nausea or vomiting Anesthetic complications: no   No notable events documented.   Last Vitals:  Vitals:   04/04/24 0754 04/04/24 0759  BP: (!) 142/74 (!) 147/82  Pulse: 68 70  Resp: 13 14  Temp: (!) 36.1 C (!) 36.1 C  SpO2: 98% 98%    Last Pain:  Vitals:   04/04/24 0759  PainSc: 0-No pain                 Lynwood KANDICE Clause

## 2024-04-04 NOTE — Transfer of Care (Signed)
 Immediate Anesthesia Transfer of Care Note  Patient: ARANZA GEDDES  Procedure(s) Performed: PHACOEMULSIFICATION, CATARACT, WITH IOL INSERTION 7.06 00:50.3 (Left: Eye)  Patient Location: PACU  Anesthesia Type:MAC  Level of Consciousness: awake and alert   Airway & Oxygen Therapy: Patient Spontanous Breathing  Post-op Assessment: Report given to RN and Post -op Vital signs reviewed and stable  Post vital signs: Reviewed and stable  Last Vitals:  Vitals Value Taken Time  BP 142/74 04/04/24 07:54  Temp 36.1 C 04/04/24 07:54  Pulse 67 04/04/24 07:55  Resp 12 04/04/24 07:55  SpO2 97 % 04/04/24 07:55  Vitals shown include unfiled device data.  Last Pain:  Vitals:   04/04/24 0754  PainSc: 0-No pain      Patients Stated Pain Goal: 0 (04/04/24 0640)  Complications: No notable events documented.

## 2024-04-04 NOTE — Anesthesia Preprocedure Evaluation (Signed)
 Anesthesia Evaluation  Patient identified by MRN, date of birth, ID band Patient awake    Reviewed: Allergy & Precautions, H&P , NPO status , Patient's Chart, lab work & pertinent test results, reviewed documented beta blocker date and time   Airway Mallampati: II  TM Distance: >3 FB Neck ROM: full    Dental no notable dental hx. (+) Teeth Intact   Pulmonary neg pulmonary ROS   Pulmonary exam normal breath sounds clear to auscultation       Cardiovascular Exercise Tolerance: Poor hypertension, On Medications negative cardio ROS  Rhythm:regular Rate:Normal     Neuro/Psych negative neurological ROS  negative psych ROS   GI/Hepatic negative GI ROS, Neg liver ROS,,,  Endo/Other  negative endocrine ROSdiabetes, Well Controlled    Renal/GU      Musculoskeletal   Abdominal   Peds  Hematology  (+) Blood dyscrasia, anemia   Anesthesia Other Findings   Reproductive/Obstetrics negative OB ROS                             Anesthesia Physical Anesthesia Plan  ASA: 2  Anesthesia Plan: MAC   Post-op Pain Management:    Induction:   PONV Risk Score and Plan: 2  Airway Management Planned:   Additional Equipment:   Intra-op Plan:   Post-operative Plan:   Informed Consent: I have reviewed the patients History and Physical, chart, labs and discussed the procedure including the risks, benefits and alternatives for the proposed anesthesia with the patient or authorized representative who has indicated his/her understanding and acceptance.       Plan Discussed with: CRNA  Anesthesia Plan Comments:        Anesthesia Quick Evaluation

## 2024-04-04 NOTE — Op Note (Signed)
 OPERATIVE NOTE  Margaret Rose [id number] 04/04/2024   PREOPERATIVE DIAGNOSIS:  Nuclear sclerotic cataract left eye.  H25.12   POSTOPERATIVE DIAGNOSIS:      Nuclear sclerotic cataract left eye.   Intraoperative floppy iris syndrome.   PROCEDURE:  CPT [phone] Complex Phacoemusification with posterior chamber intraocular lens placement of the left eye, requiring use of malyugin ring for dilation and stabilization of the iris  LENS:   Implant Name Type Inv. Item Serial No. Manufacturer Lot No. LRB No. Used Action  LENS IOL TECNIS EYHANCE 22.0 - [id number] Intraocular Lens LENS IOL TECNIS EYHANCE 22.0 [id number] [employer]  Left 1 Implanted      Procedure(s): PHACOEMULSIFICATION, CATARACT, WITH IOL INSERTION 7.06 00:50.3 (Left)  SURGEON:  Adine Novak, MD, MPH   ANESTHESIA:  Topical with tetracaine  drops augmented with 1% preservative-free intracameral lidocaine .  ESTIMATED BLOOD LOSS: <1 mL   COMPLICATIONS:  None.   DESCRIPTION OF PROCEDURE:  The patient was identified in the holding room and transported to the operating room and placed in the supine position under the operating microscope.  The left eye was identified as the operative eye and it was prepped and draped in the usual sterile ophthalmic fashion.   A 1.0 millimeter clear-corneal paracentesis was made at the 5:00 position. 0.5 ml of preservative-free 1% lidocaine  with epinephrine  was injected into the anterior chamber.  The anterior chamber was filled with viscoelastic.  A 2.4 millimeter keratome was used to make a near-clear corneal incision at the 2:00 position.    A malyugin ring was placed without difficulty  A curvilinear capsulorrhexis was made with a cystotome and capsulorrhexis forceps.  Balanced salt  solution was used to hydrodissect and hydrodelineate the nucleus.   Phacoemulsification was then used in stop and chop fashion to remove the lens nucleus and epinucleus.  The remaining cortex was then removed using the  irrigation and aspiration handpiece. Viscoelastic was then placed into the capsular bag to distend it for lens placement.  A lens was then injected into the capsular bag.  The ring was removed. The remaining viscoelastic was aspirated.   Wounds were hydrated with balanced salt  solution.  The anterior chamber was inflated to a physiologic pressure with balanced salt  solution.   Intracameral vigamox  0.1 mL undiltued was injected into the eye and a drop placed onto the ocular surface.  No wound leaks were noted.  Combigan drops were placed.  The patient was taken to the recovery room in stable condition without complications of anesthesia or surgery  Adine Novak 04/04/2024, 7:53 AM

## 2024-04-05 ENCOUNTER — Encounter: Payer: Self-pay | Admitting: Ophthalmology
